# Patient Record
Sex: Female | Born: 2000 | Race: White | Hispanic: No | Marital: Single | Smoking: Never smoker
Health system: Southern US, Community
[De-identification: ages and names within clinical notes are randomized; demographics above are authoritative.]

## PROBLEM LIST (undated history)

## (undated) DIAGNOSIS — F419 Anxiety disorder, unspecified: Secondary | ICD-10-CM

## (undated) DIAGNOSIS — F32A Depression, unspecified: Secondary | ICD-10-CM

## (undated) HISTORY — DX: Depression, unspecified: F32.A

## (undated) HISTORY — DX: Anxiety disorder, unspecified: F41.9

## (undated) HISTORY — PX: WRIST FRACTURE SURGERY: SHX121

---
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
## 2019-12-05 ENCOUNTER — Ambulatory Visit
Admission: EM | Admit: 2019-12-05 | Discharge: 2019-12-05 | Disposition: A | Discharge status: Home / Self Care | Payer: Medicaid Other | Attending: Urgent Care | Admitting: Urgent Care

## 2019-12-05 DIAGNOSIS — R070 Pain in throat: Secondary | ICD-10-CM

## 2019-12-05 DIAGNOSIS — J3489 Other specified disorders of nose and nasal sinuses: Secondary | ICD-10-CM | POA: Diagnosis present

## 2019-12-05 DIAGNOSIS — J069 Acute upper respiratory infection, unspecified: Secondary | ICD-10-CM | POA: Diagnosis present

## 2019-12-05 LAB — POCT RAPID STREP A (OFFICE): Rapid Strep A Screen: NEGATIVE

## 2019-12-05 MED ORDER — PSEUDOEPHEDRINE HCL 60 MG PO TABS: 30.0000 mg | ORAL_TABLET | Freq: Three times a day (TID) | ORAL | 0 refills | Status: DC | PRN

## 2019-12-05 MED ORDER — CETIRIZINE HCL 10 MG PO TABS: 10.0000 mg | ORAL_TABLET | Freq: Every day | ORAL | 0 refills | Status: DC

## 2019-12-05 MED ORDER — PROMETHAZINE-DM 6.25-15 MG/5ML PO SYRP: 5.0000 mL | ORAL_SOLUTION | Freq: Every evening | ORAL | 0 refills | Status: DC | PRN

## 2019-12-05 MED ORDER — BENZONATATE 100 MG PO CAPS: 100.0000 mg | ORAL_CAPSULE | Freq: Three times a day (TID) | ORAL | 0 refills | Status: DC | PRN

## 2019-12-05 NOTE — ED Provider Notes (Signed)
Vivien Rossetti   MRN: [id number] DOB: 10/21/2000  Subjective:   Sonya Sanders is a 19 y.o. female presenting for 4-day history of cute onset throat pain, runny stuffy nose, cough, subjective fever.  Patient is likely vaccinated.  She has not had COVID-19.  Has not tried medications for relief.  No current facility-administered medications for this encounter.  Current Outpatient Medications:  .  naproxen sodium (ALEVE) 220 MG tablet, Take 220 mg by mouth., Disp: , Rfl:  .  ondansetron (ZOFRAN) 4 MG tablet, Take 1 tablet (4 mg total) by mouth every 8 (eight) hours as needed for nausea or vomiting., Disp: 20 tablet, Rfl: 0   No Known Allergies  History reviewed. No pertinent past medical history.   Past Surgical History:  Procedure Laterality Date  . WRIST FRACTURE SURGERY      Family History  Problem Relation Age of Onset  . Healthy Mother   . Diabetes Father     Social History   Tobacco Use  . Smoking status: Never Smoker  . Smokeless tobacco: Never Used  Vaping Use  . Vaping Use: Every day  . Substances: Flavoring  Substance Use Topics  . Alcohol use: No  . Drug use: No    ROS   Objective:   Vitals: BP 114/81 (BP Location: Left Arm)   Pulse 96   Temp 99.8 F (37.7 C) (Oral)   Resp 18   LMP 11/07/2019   SpO2 98%   Physical Exam Constitutional:      General: She is not in acute distress.    Appearance: Normal appearance. She is well-developed. She is not ill-appearing, toxic-appearing or diaphoretic.  HENT:     Head: Normocephalic and atraumatic.     Right Ear: Tympanic membrane and ear canal normal. No drainage or tenderness. No middle ear effusion. Tympanic membrane is not erythematous.     Left Ear: Tympanic membrane and ear canal normal. No drainage or tenderness.  No middle ear effusion. Tympanic membrane is not erythematous.     Nose: Nose normal. No congestion or rhinorrhea.     Mouth/Throat:     Mouth: Mucous membranes are moist.  No oral lesions.     Pharynx: No pharyngeal swelling, oropharyngeal exudate, posterior oropharyngeal erythema or uvula swelling.     Tonsils: No tonsillar exudate or tonsillar abscesses.  Eyes:     General: No scleral icterus.       Right eye: No discharge.        Left eye: No discharge.     Extraocular Movements: Extraocular movements intact.     Right eye: Normal extraocular motion.     Left eye: Normal extraocular motion.     Conjunctiva/sclera: Conjunctivae normal.     Pupils: Pupils are equal, round, and reactive to light.  Cardiovascular:     Rate and Rhythm: Normal rate and regular rhythm.     Pulses: Normal pulses.     Heart sounds: Normal heart sounds. No murmur heard.  No friction rub. No gallop.   Pulmonary:     Effort: Pulmonary effort is normal. No respiratory distress.     Breath sounds: Normal breath sounds. No stridor. No wheezing, rhonchi or rales.  Musculoskeletal:     Cervical back: Normal range of motion and neck supple.  Lymphadenopathy:     Cervical: No cervical adenopathy.  Skin:    General: Skin is warm and dry.     Findings: No rash.  Neurological:     General:  No focal deficit present.     Mental Status: She is alert and oriented to person, place, and time.  Psychiatric:        Mood and Affect: Mood normal.        Behavior: Behavior normal.        Thought Content: Thought content normal.        Judgment: Judgment normal.     Results for orders placed or performed during the hospital encounter of 12/05/19 (from the past 24 hour(s))  POCT rapid strep A     Status: None   Collection Time: 12/05/19  6:07 PM  Result Value Ref Range   Rapid Strep A Screen Negative Negative    Assessment and Plan :   PDMP not reviewed this encounter.  1. Viral URI with cough   2. Throat pain   3. Stuffy and runny nose     Will manage for viral illness such as viral URI, viral syndrome, viral rhinitis, COVID-19. Counseled patient on nature of COVID-19 including  modes of transmission, diagnostic testing, management and supportive care.  Offered scripts for symptomatic relief. COVID 19 testing is pending. Counseled patient on potential for adverse effects with medications prescribed/recommended today, ER and return-to-clinic precautions discussed, patient verbalized understanding.     Wallis Bamberg, PA-C 12/06/19 1016

## 2019-12-05 NOTE — ED Triage Notes (Signed)
Pt reports sore throat starting 4 days worsening last night.  Also reports nasal congestion, cough, possibly fevered.

## 2019-12-05 NOTE — Discharge Instructions (Signed)

## 2019-12-07 LAB — NOVEL CORONAVIRUS, NAA: SARS-CoV-2, NAA: NOT DETECTED

## 2019-12-07 LAB — SARS-COV-2, NAA 2 DAY TAT

## 2019-12-09 LAB — CULTURE, GROUP A STREP (THRC)

## 2020-04-04 IMAGING — DX RIGHT WRIST - COMPLETE 3+ VIEW
4 series · 4 of 4 positions shown · non-contrast
Comparison: None.

CLINICAL DATA: Status post fall with pain.

EXAM:
RIGHT WRIST - COMPLETE 3+ VIEW

[wrist ap (1 of 2)]
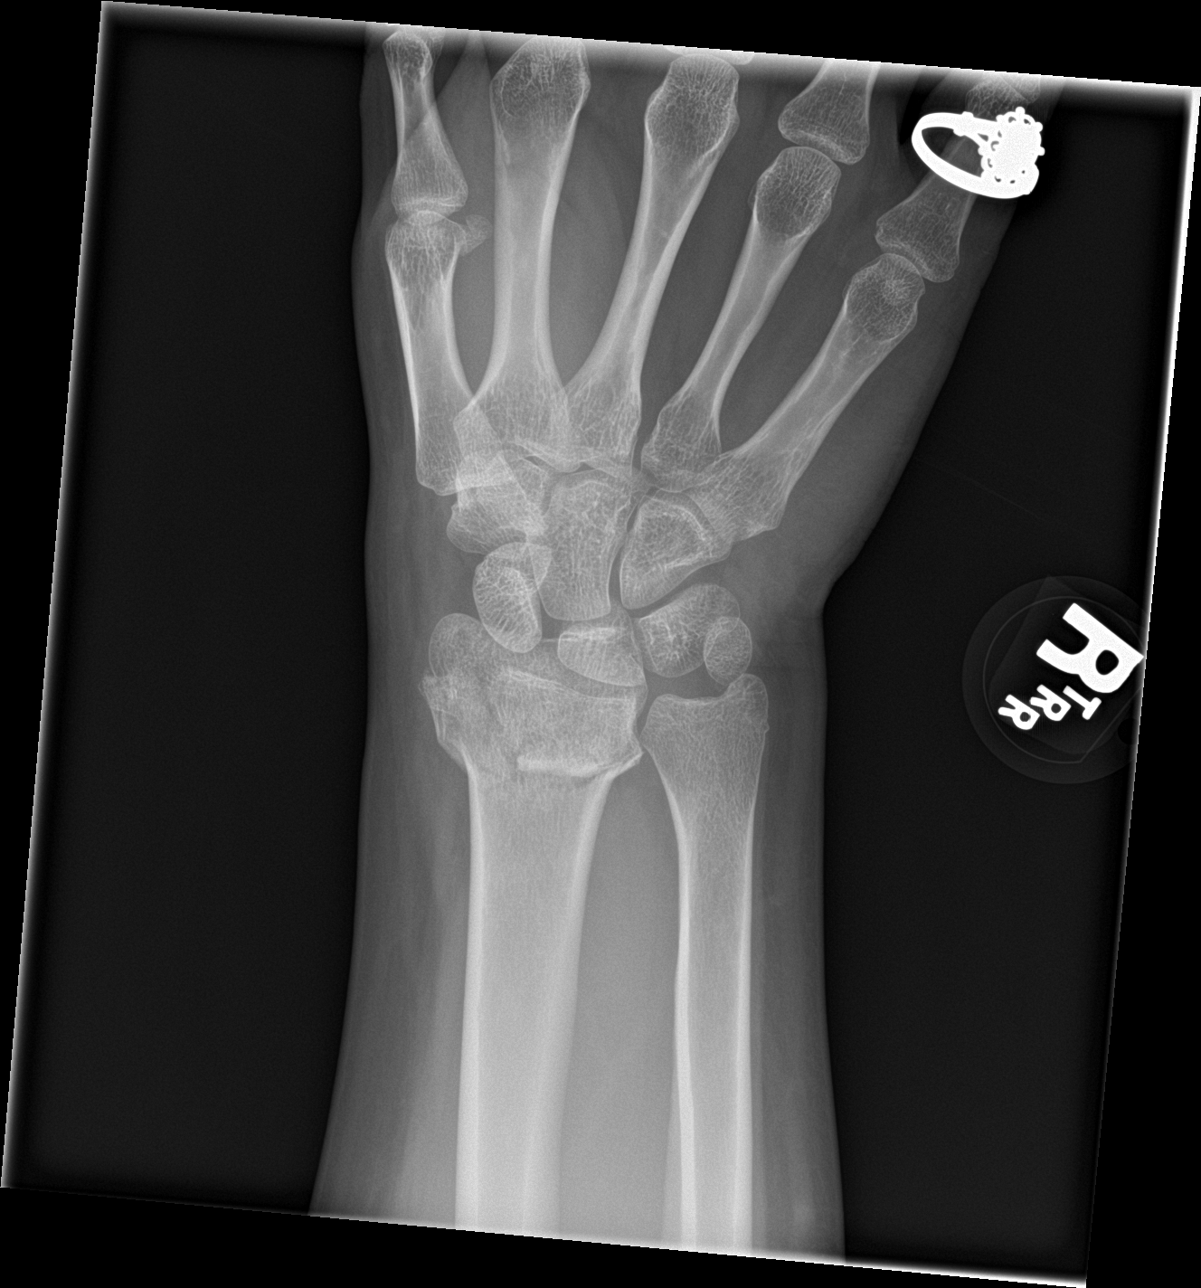

[wrist obl]
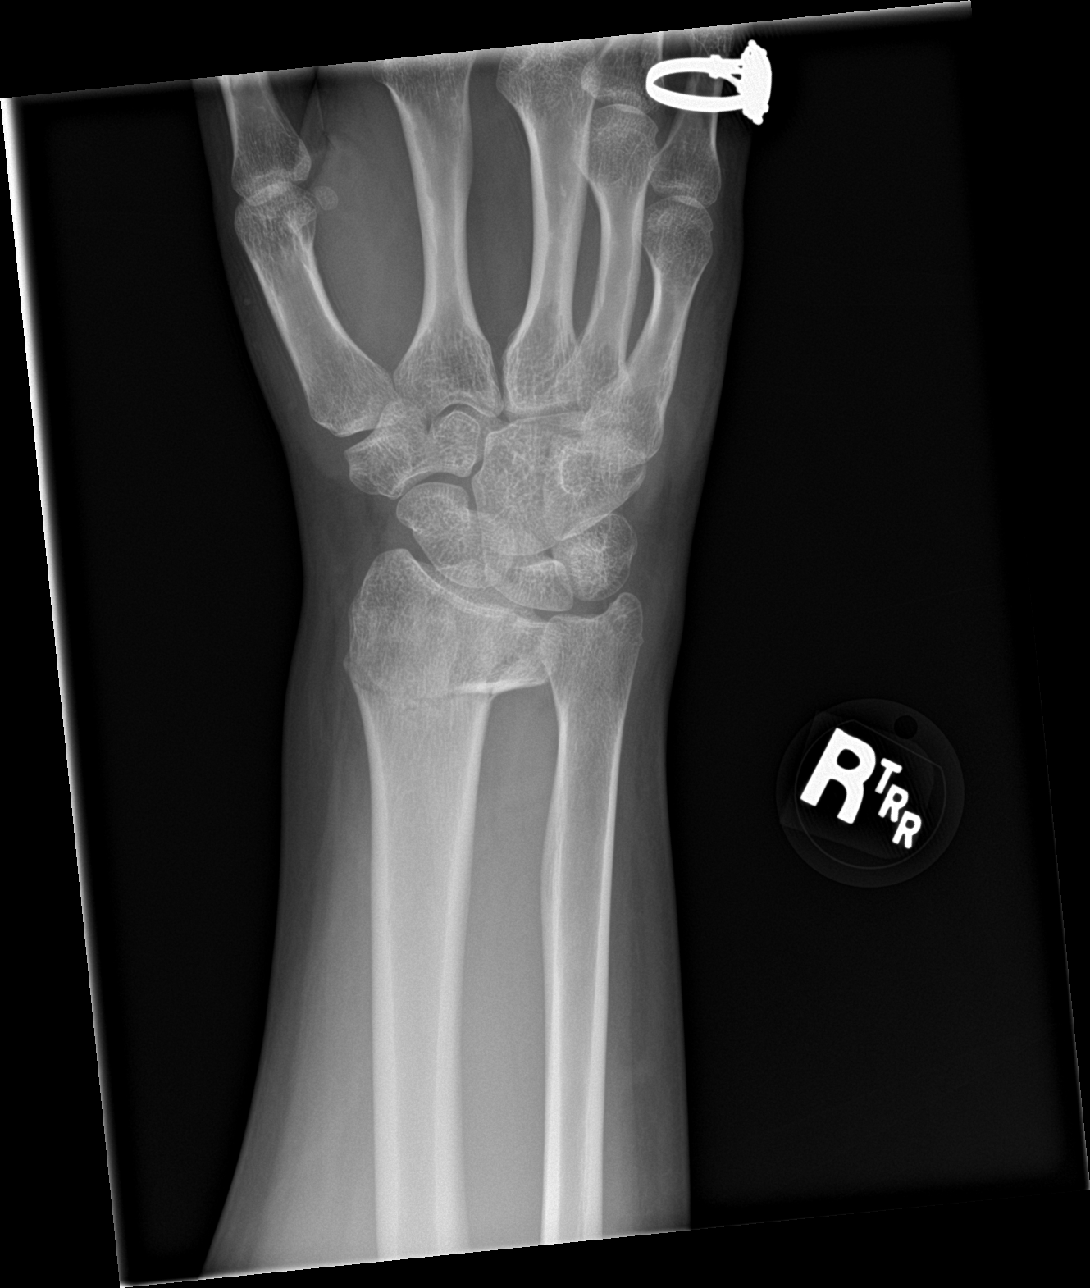

[wrist lat]
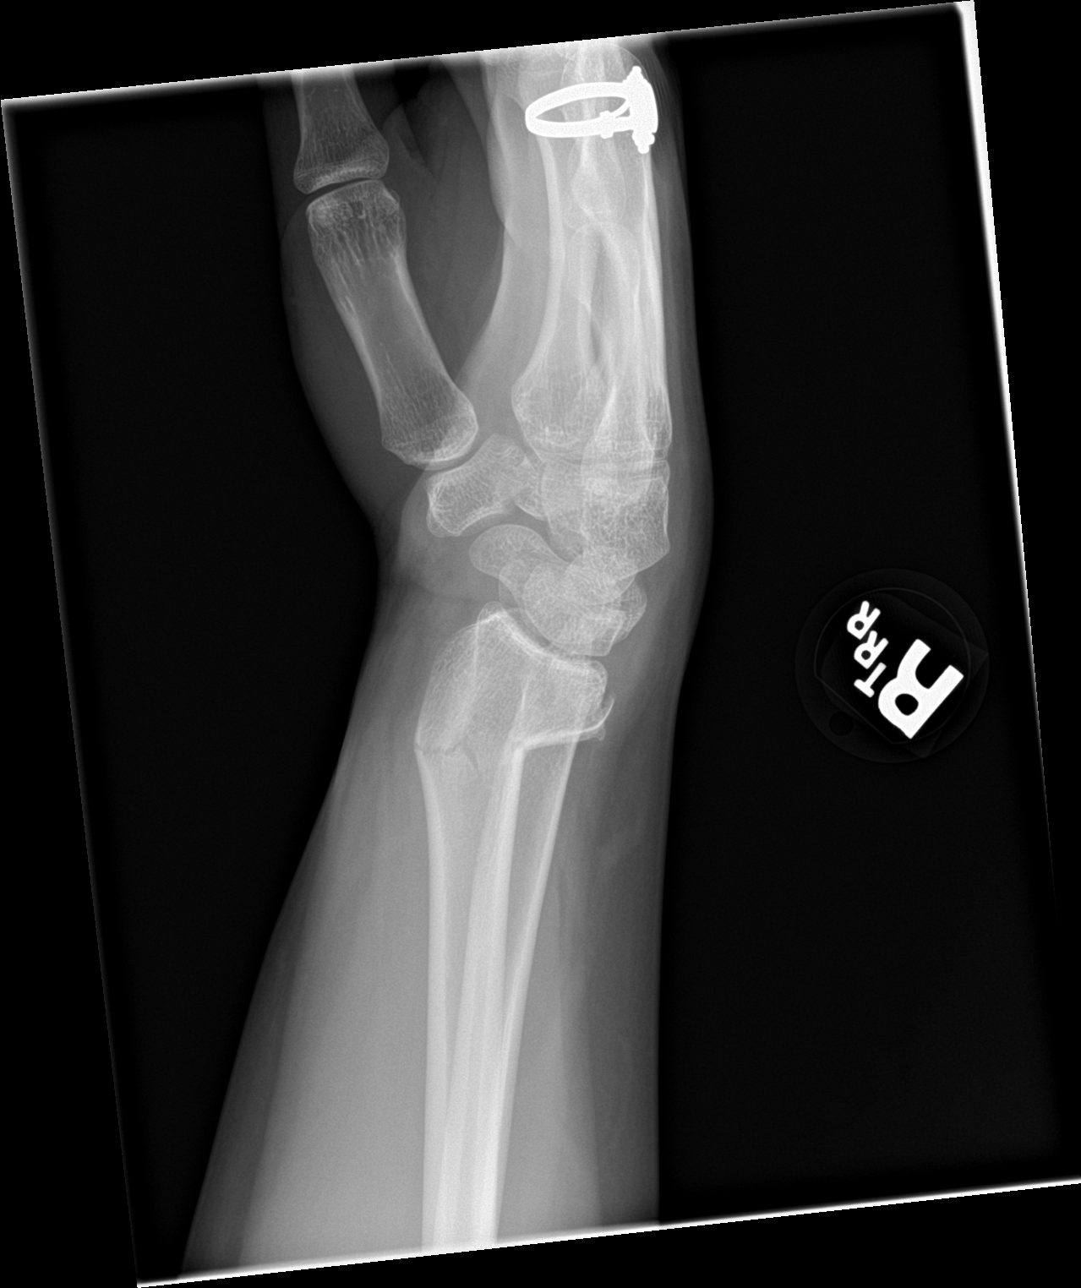

[wrist ap (2 of 2)]
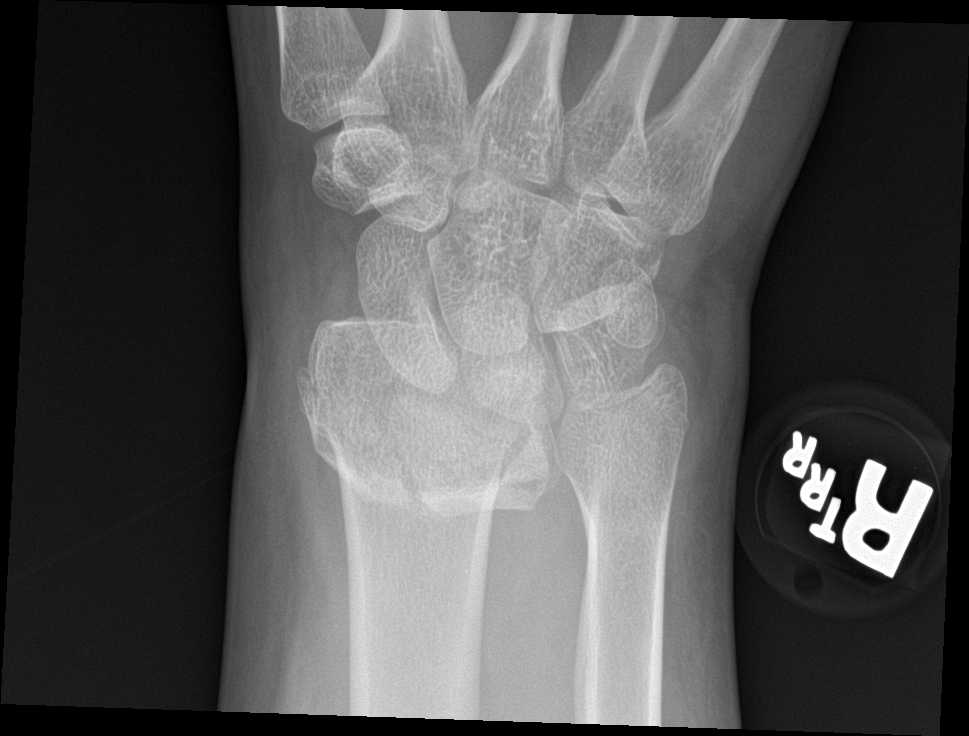

[4 of 4 positions shown; findings below may reference images not displayed]

FINDINGS: There is a comminuted fracture of the distal radius with
intra-articular extension to the radiocarpal joint. The fracture is
mildly impacted with dorsal angulation of the distal fracture
fragment.

Overlying soft tissue swelling.
IMPRESSION: Comminuted impacted intra-articular fracture of the distal radius
with dorsal angulation of the distal fracture fragment.

## 2021-11-24 ENCOUNTER — Emergency Department
Admission: EM | Admit: 2021-11-24 | Discharge: 2021-11-24 | Disposition: A | Discharge status: Home / Self Care | Payer: BC Managed Care – PPO | Attending: Emergency Medicine | Admitting: Emergency Medicine

## 2021-11-24 ENCOUNTER — Other Ambulatory Visit: Payer: Self-pay

## 2021-11-24 ENCOUNTER — Emergency Department: Payer: BC Managed Care – PPO

## 2021-11-24 DIAGNOSIS — K921 Melena: Secondary | ICD-10-CM | POA: Diagnosis not present

## 2021-11-24 DIAGNOSIS — K529 Noninfective gastroenteritis and colitis, unspecified: Secondary | ICD-10-CM

## 2021-11-24 DIAGNOSIS — R1013 Epigastric pain: Secondary | ICD-10-CM | POA: Diagnosis present

## 2021-11-24 LAB — CBC
HCT: 42.9 % (ref 36.0–46.0)
Hemoglobin: 13.5 g/dL (ref 12.0–15.0)
MCH: 26.6 pg (ref 26.0–34.0)
MCHC: 31.5 g/dL (ref 30.0–36.0)
MCV: 84.4 fL (ref 80.0–100.0)
Platelets: 322 10*3/uL (ref 150–400)
RBC: 5.08 MIL/uL (ref 3.87–5.11)
RDW: 13 % (ref 11.5–15.5)
WBC: 10.5 10*3/uL (ref 4.0–10.5)
nRBC: 0 % (ref 0.0–0.2)

## 2021-11-24 LAB — TYPE AND SCREEN
ABO/RH(D): A POS
Antibody Screen: NEGATIVE

## 2021-11-24 LAB — URINALYSIS, ROUTINE W REFLEX MICROSCOPIC
Bilirubin Urine: NEGATIVE
Glucose, UA: NEGATIVE mg/dL
Ketones, ur: NEGATIVE mg/dL
Leukocytes,Ua: NEGATIVE
Nitrite: NEGATIVE
Protein, ur: NEGATIVE mg/dL
Specific Gravity, Urine: 1.003 — ABNORMAL LOW (ref 1.005–1.030)
WBC, UA: NONE SEEN WBC/hpf (ref 0–5)
pH: 6 (ref 5.0–8.0)

## 2021-11-24 LAB — COMPREHENSIVE METABOLIC PANEL
ALT: 18 U/L (ref 0–44)
AST: 23 U/L (ref 15–41)
Albumin: 4.1 g/dL (ref 3.5–5.0)
Alkaline Phosphatase: 42 U/L (ref 38–126)
Anion gap: 8 (ref 5–15)
BUN: 11 mg/dL (ref 6–20)
CO2: 26 mmol/L (ref 22–32)
Calcium: 9.2 mg/dL (ref 8.9–10.3)
Chloride: 104 mmol/L (ref 98–111)
Creatinine, Ser: 0.64 mg/dL (ref 0.44–1.00)
GFR, Estimated: >60 mL/min (ref >60)
Glucose, Bld: 89 mg/dL (ref 70–99)
Potassium: 3.9 mmol/L (ref 3.5–5.1)
Sodium: 138 mmol/L (ref 135–145)
Total Bilirubin: 0.7 mg/dL (ref 0.3–1.2)
Total Protein: 8 g/dL (ref 6.5–8.1)

## 2021-11-24 LAB — LIPASE, BLOOD: Lipase: 35 U/L (ref 11–51)

## 2021-11-24 LAB — POC URINE PREG, ED: Preg Test, Ur: NEGATIVE

## 2021-11-24 MED ORDER — LACTATED RINGERS IV BOLUS
1000.0000 mL | Freq: Once | INTRAVENOUS | Status: AC
  Administered 2021-11-24: 1000 mL via INTRAVENOUS

## 2021-11-24 MED ORDER — AMOXICILLIN-POT CLAVULANATE 875-125 MG PO TABS: 1.0000 | ORAL_TABLET | Freq: Two times a day (BID) | ORAL | 0 refills | Status: AC

## 2021-11-24 MED ORDER — IOHEXOL 300 MG/ML  SOLN
100.0000 mL | Freq: Once | INTRAMUSCULAR | Status: AC | PRN
  Administered 2021-11-24: 100 mL via INTRAVENOUS

## 2021-11-24 MED ORDER — AMOXICILLIN-POT CLAVULANATE 875-125 MG PO TABS: 1.0000 | ORAL_TABLET | Freq: Two times a day (BID) | ORAL | 0 refills | Status: DC

## 2021-11-24 MED ORDER — ONDANSETRON 4 MG PO TBDP: 4.0000 mg | ORAL_TABLET | Freq: Three times a day (TID) | ORAL | 0 refills | Status: DC | PRN

## 2021-11-24 NOTE — ED Triage Notes (Signed)
Pt c/o intermittent upper abdominal cramping, nausea, and diarrhea x1 day.  Pain score 8/10.  Pt reports multiple BMs yesterday and sts last BM included bright red blood.

## 2021-11-24 NOTE — ED Provider Notes (Signed)
[facility] Provider Note    Event Date/Time   First MD Initiated Contact with Patient 11/24/21 1658     (approximate)   History   Chief Complaint Abdominal Pain, Nausea, and Diarrhea   HPI  Sonya Sanders is a 21 y.o. female with no significant past medical history who presents to the ED complaining of abdominal pain.  Patient reports that she has been dealing with about 24 hours of achy pain in her epigastrium and left lower quadrant of her abdomen.  She describes the pain as constant and not exacerbated or alleviated by anything.  It has been associated with multiple watery bowel movements and she has started to notice some bright red blood in her stool.  She has felt nauseous, but has not vomited.  She states that she started her menstrual period earlier today, denies any abnormal vaginal bleeding or discharge.  She has not had any fevers, dysuria, hematuria, or flank pain.  She denies any history of GI bleeding and does not take any blood thinners, does admit to taking ibuprofen on a regular basis.      Physical Exam   Triage Vital Signs: ED Triage Vitals  Enc Vitals Group     BP 11/24/21 1436 121/73     Pulse Rate 11/24/21 1436 68     Resp 11/24/21 1436 16     Temp 11/24/21 1436 98 F (36.7 C)     Temp Source 11/24/21 1436 Oral     SpO2 11/24/21 1436 100 %     Weight 11/24/21 1526 205 lb (93 kg)     Height 11/24/21 1526 5\' 7"  (1.702 m)     Head Circumference --      Peak Flow --      Pain Score 11/24/21 1525 8     Pain Loc --      Pain Edu? --      Excl. in GC? --     Most recent vital signs: Vitals:   11/24/21 1436  BP: 121/73  Pulse: 68  Resp: 16  Temp: 98 F (36.7 C)  SpO2: 100%    Constitutional: Alert and oriented. Eyes: Conjunctivae are normal. Head: Atraumatic. Nose: No congestion/rhinnorhea. Mouth/Throat: Mucous membranes are moist.  Cardiovascular: Normal rate, regular rhythm. Grossly normal heart sounds.  2+ radial  pulses bilaterally. Respiratory: Normal respiratory effort.  No retractions. Lungs CTAB. Gastrointestinal: Soft and tender to palpation in the left lower quadrant with no rebound or guarding. No distention. Musculoskeletal: No lower extremity tenderness nor edema.  Neurologic:  Normal speech and language. No gross focal neurologic deficits are appreciated.    ED Results / Procedures / Treatments   Labs (all labs ordered are listed, but only abnormal results are displayed) Labs Reviewed  URINALYSIS, ROUTINE W REFLEX MICROSCOPIC - Abnormal; Notable for the following components:      Result Value   Color, Urine STRAW (*)    APPearance CLEAR (*)    Specific Gravity, Urine 1.003 (*)    Hgb urine dipstick MODERATE (*)    Bacteria, UA RARE (*)    All other components within normal limits  LIPASE, BLOOD  COMPREHENSIVE METABOLIC PANEL  CBC  POC URINE PREG, ED  POC OCCULT BLOOD, ED  POC URINE PREG, ED  TYPE AND SCREEN   RADIOLOGY CT of abdomen/pelvis reviewed and interpreted by me with inflammatory changes in the colon, no dilated bowel loops or focal fluid collections noted.  PROCEDURES:  Critical Care performed:  No  Procedures   MEDICATIONS ORDERED IN ED: Medications  lactated ringers bolus 1,000 mL (1,000 mLs Intravenous New Bag/Given 11/24/21 1826)  iohexol (OMNIPAQUE) 300 MG/ML solution 100 mL (100 mLs Intravenous Contrast Given 11/24/21 1842)     IMPRESSION / MDM / ASSESSMENT AND PLAN / ED COURSE  I reviewed the triage vital signs and the nursing notes.                              21 y.o. female with no significant past medical history who presents to the ED complaining of about 24 hours of left lower quadrant and epigastric abdominal pain associated with watery bowel movements and nausea, has now noticed some blood in her stool.  Patient's presentation is most consistent with acute presentation with potential threat to life or bodily function.  Differential  diagnosis includes, but is not limited to, lower GI bleed, upper GI bleed, rectal bleeding, anemia, electrolyte abnormality, AKI, diverticulitis, colitis, UTI.  Patient well-appearing and in no acute distress, vital signs are unremarkable.  She does have tenderness to palpation in the left lower quadrant of her abdomen and we will further assess with CT scan for evidence of diverticulitis which could be contributing to diverticular bleeding.  It does seem more likely that she has a gastroenteritis or colitis contributing to mild bleeding.  Labs thus far are reassuring with no significant anemia, leukocytosis, electrolyte abnormality, or AKI.  LFTs and lipase are within normal limits, pregnancy testing and urinalysis are pending at this time.  We will hydrate with IV fluids, patient declines pain or nausea medication.  CT scan is consistent with colitis, suspect infectious cause.  Given her bloody stools, she would benefit from treatment with antibiotics, admission considered but patient has not had any further bowel movements here in the ED and she is appropriate for discharge home with PCP follow-up.  We will also prescribe nausea medication and she was counseled to establish care with PCP, also counseled to return to the ED for new or worsening symptoms.  Patient agrees with plan.      FINAL CLINICAL IMPRESSION(S) / ED DIAGNOSES   Final diagnoses:  Colitis  Hematochezia     Rx / DC Orders   ED Discharge Orders          Ordered    amoxicillin-clavulanate (AUGMENTIN) 875-125 MG tablet  2 times daily,   Status:  Discontinued        11/24/21 1930    ondansetron (ZOFRAN-ODT) 4 MG disintegrating tablet  Every 8 hours PRN,   Status:  Discontinued        11/24/21 1930    amoxicillin-clavulanate (AUGMENTIN) 875-125 MG tablet  2 times daily        11/24/21 1931    ondansetron (ZOFRAN-ODT) 4 MG disintegrating tablet  Every 8 hours PRN        11/24/21 1931             Note:  This document  was prepared using Dragon voice recognition software and may include unintentional dictation errors.   Chesley Noon, MD 11/24/21 [phone]

## 2022-03-30 ENCOUNTER — Telehealth: Payer: Self-pay | Admitting: Nurse Practitioner

## 2022-03-30 ENCOUNTER — Telehealth: Payer: Self-pay | Admitting: Licensed Clinical Social Worker

## 2022-03-30 NOTE — Telephone Encounter (Signed)
Pt no longer wants to schedule an appt

## 2022-03-30 NOTE — Telephone Encounter (Signed)
Pt is interested in scheduling NPA on 05/20/21 at 1:00p Please advise Cb- [phone]

## 2022-03-30 NOTE — Telephone Encounter (Signed)
LCSW spoke with patient regarding her request for an evaluation for medication management. LCSW discussed options going through PCP or [facility]. LCSW informed patient that she could come to [facility] for therapy if she is connected to med management through PCP, but if she goes to [facility] for med management she would need to have therapy there.

## 2022-04-13 NOTE — L&D Delivery Note (Signed)
Delivery Note  Date of delivery: 03/11/2023 Estimated Date of Delivery: 03/10/23 Patient's last menstrual period was 06/03/2022. EGA: [redacted]w[redacted]d  Delivery Note At 1:21 PM a viable female was delivered via Vaginal, Spontaneous (Presentation:   Occiput Anterior).  APGAR: 7, 9; weight 7 lb 10.8 oz (3480 g).  Placenta status: Spontaneous, Intact.  Cord: 3 vessels with the following complications: None.      First Stage: Labor onset: 0647 Augmentation : AROM, Cytotec, Pitocin Analgesia Sonya Sanders intrapartum: Epidural AROM at [name] presented to L&D for IOL for obesity. She was induced/augmented with cytotec, pitocin, and AROM. Epidural placed for pain relief.   Second Stage: Complete dilation at 1313 Onset of pushing at 1313 FHR second stage Cat I Delivery at 1321 on 03/11/2023  She progressed to complete and had a spontaneous vaginal birth of a live female over an intact perineum. The fetal head was delivered in OA position with restitution to LOA. No nuchal cord. Anterior then posterior shoulders delivered spontaneously. Baby placed on mom's abdomen and attended to by transition RN. Cord clamped and cut by CNM and baby taken to the warmer for further evaluation.   Third Stage: Placenta delivered intact with 3VC at 1324 Placenta disposition: pathology Uterine tone firm / bleeding min IV pitocin given for hemorrhage prophylaxis  Anesthesia: Epidural Episiotomy: None Lacerations: 1st degree Suture Repair: 2.0 vicryl Est. Blood Loss (mL):  150  Complications: none  Mom to postpartum.  Baby to Couplet care / Skin to Skin.  Newborn: Birth Weight: 7 lb 10.8 oz (3480 g)  Apgar Scores: 7,9 Feeding planned: breastfeeding   Sonya Sanders, CNM 03/11/2023 2:24 PM

## 2022-07-06 ENCOUNTER — Ambulatory Visit (LOCAL_COMMUNITY_HEALTH_CENTER): Payer: Medicaid Other

## 2022-07-06 VITALS — BP 120/80 | Ht 67.0 in | Wt 217.5 lb

## 2022-07-06 DIAGNOSIS — Z3201 Encounter for pregnancy test, result positive: Secondary | ICD-10-CM | POA: Diagnosis not present

## 2022-07-06 LAB — PREGNANCY, URINE: Preg Test, Ur: POSITIVE — AB

## 2022-07-06 MED ORDER — PRENATAL 27-0.8 MG PO TABS: 1.0000 | ORAL_TABLET | Freq: Every day | ORAL | 0 refills | Status: AC

## 2022-07-06 NOTE — Progress Notes (Signed)
UPT positive. Unsure where she plans prenatal care. Local prenatal provider list given. Positive preg packet given and reviewed.   Hx anxiety/depression as a teen. PHQ (2-9) = 7 today. Denies thoughts of harming self. Has support system. Accepts contact card for  A Marchia Bond, LCSW at [facility] and [facility] contact card plus info sheet on "How to Find a Therapist."  The patient was dispensed prenatal vitamins #100 today per SO Dr Vertell Novak. I provided counseling today regarding the medication. We discussed the medication, the side effects and when to call clinic. Patient given the opportunity to ask questions. Questions answered.    Sent to DSS for medicaid/preg women. Josie Saunders, RN

## 2023-01-25 DIAGNOSIS — O99210 Obesity complicating pregnancy, unspecified trimester: Secondary | ICD-10-CM | POA: Insufficient documentation

## 2023-02-12 DIAGNOSIS — F32A Depression, unspecified: Secondary | ICD-10-CM | POA: Insufficient documentation

## 2023-03-09 ENCOUNTER — Other Ambulatory Visit: Payer: Self-pay | Admitting: Certified Nurse Midwife

## 2023-03-09 DIAGNOSIS — Z3490 Encounter for supervision of normal pregnancy, unspecified, unspecified trimester: Secondary | ICD-10-CM

## 2023-03-09 NOTE — Progress Notes (Signed)
G1P0000 at [redacted]w[redacted]d, LMP of 08/18/22, c/w early [place] at [redacted]w[redacted]d.  Scheduled for induction of labor for BMI >30 on 03/10/23.   Prenatal provider: [facility] Pregnancy complicated by: Obesity Anxiety and depression Hx of heart murmurs Elevated 1hr GTT Chronic HA  Prenatal Labs: Blood type/Rh A positive  Antibody screen neg  Rubella Immune  Varicella Immune  RPR NR  HBsAg Neg  HIV NR  GC neg  Chlamydia neg  Genetic screening cfDNA negative   1 hour GTT 171  3 hour GTT 79, 178, 157, 124   GBS Negative   Tdap: received AP Flu: received AP RSV: received AP Contraception: OCPs Feeding preference: breastfeeding  ____ Janyce Llanos, CNM  Certified Nurse Midwife [facility]  Clinic OB/GYN [facility]

## 2023-03-10 ENCOUNTER — Other Ambulatory Visit: Payer: Self-pay

## 2023-03-10 ENCOUNTER — Inpatient Hospital Stay
Admission: EM | Admit: 2023-03-10 | Discharge: 2023-03-12 | DRG: 807 | Disposition: A | Discharge status: Home / Self Care | Payer: Medicaid Other | Attending: Certified Nurse Midwife | Admitting: Certified Nurse Midwife

## 2023-03-10 ENCOUNTER — Encounter: Payer: Self-pay | Admitting: Obstetrics and Gynecology

## 2023-03-10 DIAGNOSIS — O99214 Obesity complicating childbirth: Secondary | ICD-10-CM | POA: Diagnosis present

## 2023-03-10 DIAGNOSIS — Z3A40 40 weeks gestation of pregnancy: Secondary | ICD-10-CM | POA: Diagnosis not present

## 2023-03-10 DIAGNOSIS — E66813 Obesity, class 3: Secondary | ICD-10-CM | POA: Diagnosis present

## 2023-03-10 DIAGNOSIS — Z833 Family history of diabetes mellitus: Secondary | ICD-10-CM

## 2023-03-10 DIAGNOSIS — Z3490 Encounter for supervision of normal pregnancy, unspecified, unspecified trimester: Principal | ICD-10-CM | POA: Diagnosis present

## 2023-03-10 LAB — CBC
HCT: 39.8 % (ref 36.0–46.0)
Hemoglobin: 13 g/dL (ref 12.0–15.0)
MCH: 27.2 pg (ref 26.0–34.0)
MCHC: 32.7 g/dL (ref 30.0–36.0)
MCV: 83.3 fL (ref 80.0–100.0)
Platelets: 236 10*3/uL (ref 150–400)
RBC: 4.78 MIL/uL (ref 3.87–5.11)
RDW: 14.8 % (ref 11.5–15.5)
WBC: 14.3 10*3/uL — ABNORMAL HIGH (ref 4.0–10.5)
nRBC: 0 % (ref 0.0–0.2)

## 2023-03-10 LAB — RPR: RPR Ser Ql: NONREACTIVE

## 2023-03-10 LAB — TYPE AND SCREEN
ABO/RH(D): A POS
Antibody Screen: NEGATIVE

## 2023-03-10 MED ORDER — OXYTOCIN BOLUS FROM INFUSION
333.0000 mL | Freq: Once | INTRAVENOUS | Status: AC
  Administered 2023-03-11: 333 mL via INTRAVENOUS

## 2023-03-10 MED ORDER — LACTATED RINGERS IV SOLN
500.0000 mL | INTRAVENOUS | Status: DC | PRN
  Administered 2023-03-10 (×2): 500 mL via INTRAVENOUS

## 2023-03-10 MED ORDER — LACTATED RINGERS IV SOLN: INTRAVENOUS | Status: AC

## 2023-03-10 MED ORDER — MISOPROSTOL 25 MCG QUARTER TABLET
25.0000 ug | ORAL_TABLET | ORAL | Status: DC
  Administered 2023-03-10 (×4): 25 ug via VAGINAL
  Filled 2023-03-10 (×4): qty 1

## 2023-03-10 MED ORDER — MISOPROSTOL 200 MCG PO TABS
ORAL_TABLET | ORAL | Status: AC
  Filled 2023-03-10: qty 4

## 2023-03-10 MED ORDER — LIDOCAINE HCL (PF) 1 % IJ SOLN
30.0000 mL | INTRAMUSCULAR | Status: DC | PRN
  Filled 2023-03-10: qty 30

## 2023-03-10 MED ORDER — OXYTOCIN 10 UNIT/ML IJ SOLN
INTRAMUSCULAR | Status: AC
  Filled 2023-03-10: qty 2

## 2023-03-10 MED ORDER — OXYTOCIN-SODIUM CHLORIDE 30-0.9 UT/500ML-% IV SOLN
2.5000 [IU]/h | INTRAVENOUS | Status: DC
  Filled 2023-03-10: qty 500

## 2023-03-10 MED ORDER — FENTANYL CITRATE (PF) 100 MCG/2ML IJ SOLN
50.0000 ug | INTRAMUSCULAR | Status: DC | PRN
  Administered 2023-03-10: 50 ug via INTRAVENOUS

## 2023-03-10 MED ORDER — AMMONIA AROMATIC IN INHA
RESPIRATORY_TRACT | Status: AC
  Filled 2023-03-10: qty 10

## 2023-03-10 MED ORDER — SOD CITRATE-CITRIC ACID 500-334 MG/5ML PO SOLN: 30.0000 mL | ORAL | Status: DC | PRN

## 2023-03-10 MED ORDER — MISOPROSTOL 25 MCG QUARTER TABLET
25.0000 ug | ORAL_TABLET | ORAL | Status: DC
  Administered 2023-03-10 (×4): 25 ug via ORAL
  Filled 2023-03-10 (×4): qty 1

## 2023-03-10 MED ORDER — TERBUTALINE SULFATE 1 MG/ML IJ SOLN: 0.2500 mg | Freq: Once | INTRAMUSCULAR | Status: DC | PRN

## 2023-03-10 MED ORDER — OXYTOCIN-SODIUM CHLORIDE 30-0.9 UT/500ML-% IV SOLN
1.0000 m[IU]/min | INTRAVENOUS | Status: DC
  Administered 2023-03-10: 2 m[IU]/min via INTRAVENOUS
  Administered 2023-03-11: 8 m[IU]/min via INTRAVENOUS

## 2023-03-10 MED ORDER — FENTANYL CITRATE (PF) 100 MCG/2ML IJ SOLN
50.0000 ug | INTRAMUSCULAR | Status: DC | PRN
  Administered 2023-03-10: 100 ug via INTRAVENOUS
  Administered 2023-03-10: 50 ug via INTRAVENOUS
  Administered 2023-03-10: 100 ug via INTRAVENOUS
  Filled 2023-03-10 (×4): qty 2

## 2023-03-10 MED ORDER — ACETAMINOPHEN 325 MG PO TABS: 650.0000 mg | ORAL_TABLET | ORAL | Status: DC | PRN

## 2023-03-10 MED ORDER — ONDANSETRON HCL 4 MG/2ML IJ SOLN
4.0000 mg | Freq: Four times a day (QID) | INTRAMUSCULAR | Status: DC | PRN
  Administered 2023-03-11: 4 mg via INTRAVENOUS
  Filled 2023-03-10: qty 2

## 2023-03-10 NOTE — Progress Notes (Addendum)
OB Labor Note   S: Doing well. Amenable to cook catheter. Reports cramping but no real contractions.     O: BP 128/76 (BP Location: Right Arm)   Pulse 91   Temp 98.4 F (36.9 C) (Oral)   Resp 16   Ht 5\' 7"  (1.702 m)   Wt 116.1 kg   LMP 06/03/2022   BMI 40.10 kg/m  Temp (24hrs), Avg:98.3 F (36.8 C), Min:98 F (36.7 C), Max:98.5 F (36.9 C)    FHT: Baseline 145, mod variability, + accels, no decels. Cat 1  TOCO: not tracing well at times, previously q89min    Cervical Exam: 2/60/-3   A/P: Sonya Sanders is a 22 y.o. G1P0000 [redacted]w[redacted]d admitted for IOL at term for obesity.  - Cervical dilation: Presented 03/10/23 at 0000 and cervix was 0.5 cm. Received 4 doses of vaginal/oral cytotec (first at 0037 and last at 1414) and cervix changed to 1 cm at 1730. Cervix 2 cm at 1815 and cook catheter (80/80cc) placed. Will start pitocin. - CEFM: Cat 1  - Vitals: afebrile, HR normal, BP normal - Rh + - GBS neg  - Pain tolerable. Desires epidural at some point.   #Anxiety/depression: Not on any meds. Mood stable   Electronically signed by:  Romana Juniper, MD, 03/10/2023, 5:48 PM

## 2023-03-10 NOTE — H&P (Signed)
OB History & Physical   History of Present Illness:  Chief Complaint:   HPI:  Sonya Sanders is a 22 y.o. G1P0000 female at [redacted]w[redacted]d dated by LMP c/w [redacted]w[redacted]d [place].  She presents to L&D for scheduled IOL for obesity, BMI >30    Pregnancy Issues: Obesity Anxiety and depression Family Hx of heart murmurs Elevated 1hr GTT Chronic HA   Maternal Medical History:   Past Medical History:  Diagnosis Date   Anxiety    as a teenager   Depression    as a teenager    Past Surgical History:  Procedure Laterality Date   WRIST FRACTURE SURGERY Right 2020    No Known Allergies  Prior to Admission medications   Medication Sig Start Date End Date Taking? Authorizing Provider  benzonatate (TESSALON) 100 MG capsule Take 1-2 capsules (100-200 mg total) by mouth 3 (three) times daily as needed. Patient not taking: Reported on 07/06/2022 12/05/19   Wallis Bamberg, PA-C  cetirizine (ZYRTEC ALLERGY) 10 MG tablet Take 1 tablet (10 mg total) by mouth daily. Patient not taking: Reported on 07/06/2022 12/05/19   Wallis Bamberg, PA-C  naproxen sodium (ALEVE) 220 MG tablet Take 220 mg by mouth. Patient not taking: Reported on 07/06/2022    [provider]  ondansetron (ZOFRAN-ODT) 4 MG disintegrating tablet Take 1 tablet (4 mg total) by mouth every 8 (eight) hours as needed for nausea or vomiting. Patient not taking: Reported on 07/06/2022 11/24/21   Chesley Noon, MD  promethazine-dextromethorphan (PROMETHAZINE-DM) 6.25-15 MG/5ML syrup Take 5 mLs by mouth at bedtime as needed for cough. Patient not taking: Reported on 07/06/2022 12/05/19   Wallis Bamberg, PA-C  pseudoephedrine (SUDAFED) 60 MG tablet Take 0.5 tablets (30 mg total) by mouth every 8 (eight) hours as needed for congestion. Patient not taking: Reported on 07/06/2022 12/05/19   Wallis Bamberg, PA-C    Prenatal care site: [facility]   Social History: She  reports that she has never smoked. She has never used smokeless tobacco. She reports  that she does not currently use alcohol. She reports that she does not currently use drugs after having used the following drugs: Marijuana. Frequency: 2.00 times per week.  Family History: family history includes Diabetes in her father; Healthy in her mother.   Review of Systems: A full review of systems was performed and negative except as noted in the HPI.     Physical Exam:  Vital Signs: BP 115/75 (BP Location: Left Arm)   Pulse 96   Temp 98.5 F (36.9 C) (Oral)   Resp 18   Ht 5\' 7"  (1.702 m)   Wt 116.1 kg   LMP 06/03/2022   BMI 40.10 kg/m  General: no acute distress.  HEENT: normocephalic, atraumatic Heart: regular rate & rhythm.  No murmurs/rubs/gallops Lungs: clear to auscultation bilaterally, normal respiratory effort Abdomen: soft, gravid, non-tender;  EFW: 8lb Pelvic:   External: Normal external female genitalia  Cervix: Dilation: Fingertip / Effacement (%): 50 / Station: -2    Extremities: non-tender, symmetric, mild edema bilaterally.  DTRs: +2  Neurologic: Alert & oriented x 3.    Results for orders placed or performed during the hospital encounter of 03/10/23 (from the past 24 hour(s))  CBC     Status: Abnormal   Collection Time: 03/10/23 12:19 AM  Result Value Ref Range   WBC 14.3 (H) 4.0 - 10.5 K/uL   RBC 4.78 3.87 - 5.11 MIL/uL   Hemoglobin 13.0 12.0 - 15.0 g/dL  HCT 39.8 36.0 - 46.0 %   MCV 83.3 80.0 - 100.0 fL   MCH 27.2 26.0 - 34.0 pg   MCHC 32.7 30.0 - 36.0 g/dL   RDW 21.3 08.6 - 57.8 %   Platelets 236 150 - 400 K/uL   nRBC 0.0 0.0 - 0.2 %  Type and screen     Status: None (Preliminary result)   Collection Time: 03/10/23 12:19 AM  Result Value Ref Range   ABO/RH(D) PENDING    Antibody Screen PENDING    Sample Expiration      03/13/2023,2359 Performed at [facility], [address], [place], [place] [address]     Pertinent Results:  Prenatal Labs: Blood type/Rh A positive  Antibody screen neg  Rubella Immune  Varicella Immune   RPR NR  HBsAg Neg  HIV NR  GC neg  Chlamydia neg  Genetic screening cfDNA negative   1 hour GTT 171  3 hour GTT 79, 178, 157, 124   GBS Negative   FHT: 135bpm, moderate variability, accelerations present, no decelerations TOCO: occasional SVE:  Dilation: Fingertip / Effacement (%): 50 / Station: -2    Cephalic by leopolds  No results found.  Assessment:  Sonya Sanders is a 22 y.o. G1P0000 female at [redacted]w[redacted]d with IOL for obesity.   Plan:  1. Admit to Labor & Delivery; consents reviewed and obtained - Dr. Algis Downs. Schermerhorn aware of admission  2. Fetal Well being  - Fetal Tracing: Category I tracing - Group B Streptococcus ppx indicated: n/a, GBS negative - Presentation: vertex confirmed by SVE   3. Routine OB: - Prenatal labs reviewed, as above - Rh positive - CBC, T&S, RPR on admit - Clear fluids, saline lock  4. Induction of Labor -  Contractions rare, external toco in place -  Pelvis adequate for trial of labor -  Plan for induction with cytotec, pitocin, AROM -  Plan for continuous fetal monitoring  -  Maternal pain control as desired; requesting regional anesthesia - Anticipate vaginal delivery  5. Post Partum Planning: - Infant feeding: breastfeeding - Contraception: OCPs - Tdap: received AP - Flu: received AP - RSV: received AP  Janyce Llanos, CNM 03/10/23 12:59 AM

## 2023-03-10 NOTE — Progress Notes (Signed)
L&D Note    Subjective:  Patient is resting comfortably in bed. She states she feel mild cramping. "They feel like light period cramps."  Objective:   Vitals:   03/10/23 0032 03/10/23 0706 03/10/23 1155 03/10/23 1709  BP: 115/75 110/69 113/70 128/76  Pulse: 96 80 93 91  Resp: 18 15 16 16   Temp: 98.5 F (36.9 C) 98.2 F (36.8 C) 98 F (36.7 C) 98.4 F (36.9 C)  TempSrc: Oral Oral Oral Oral  Weight:      Height:        Current Vital Signs 24h Vital Sign Ranges  T 98.4 F (36.9 C) Temp  Avg: 98.3 F (36.8 C)  Min: 98 F (36.7 C)  Max: 98.5 F (36.9 C)  BP 128/76 BP  Min: 110/69  Max: 128/76  HR 91 Pulse  Avg: 90  Min: 80  Max: 96  RR 16 Resp  Avg: 16.3  Min: 15  Max: 18  SaO2   Room Air No data recorded      Gen: alert, cooperative, no distress FHR: Baseline: 130 bpm, Variability: moderate, Accels: Present, Decels: none Toco: occasional  SVE: 1 Dilation: 2 Effacement: 50% Station: -2, ballotable Presentation: Vertex Exam by: Roney Jaffe, CNM   Medications SCHEDULED MEDICATIONS   misoprostol  25 mcg Oral Q4H   And   misoprostol  25 mcg Vaginal Q4H   oxytocin 40 units in LR 1000 mL  333 mL Intravenous Once    MEDICATION INFUSIONS   lactated ringers 500 mL (03/10/23 1553)   lactated ringers 125 mL/hr at 03/10/23 1718   oxytocin     oxytocin 2 milli-units/min (03/10/23 1829)    PRN MEDICATIONS  acetaminophen, fentaNYL (SUBLIMAZE) injection, fentaNYL (SUBLIMAZE) injection, lactated ringers, lidocaine (PF), ondansetron, sodium citrate-citric acid, terbutaline   Assessment & Plan:  22 y.o. G1P0000 at [redacted]w[redacted]d admitted for IOL for obesity, BMI > 30.  -Labor: Early latent labor. -Fetal Well-being: Category I -GBS: negative -Membranes intact -Continue present management; Attempt at Cook's cath and was unsuccessful. 3rd dose of Cytotec to be place as appropriate.  -Analgesia: regional anesthesia at patient's request.    Roney Jaffe, CNM  03/10/2023 6:45 PM   [facility]

## 2023-03-11 ENCOUNTER — Inpatient Hospital Stay: Payer: Medicaid Other | Admitting: Anesthesiology

## 2023-03-11 ENCOUNTER — Encounter: Payer: Self-pay | Admitting: Obstetrics and Gynecology

## 2023-03-11 MED ORDER — LIDOCAINE-EPINEPHRINE (PF) 1.5 %-1:200000 IJ SOLN
INTRAMUSCULAR | Status: DC | PRN
  Administered 2023-03-11: 3 mL via PERINEURAL

## 2023-03-11 MED ORDER — IBUPROFEN 600 MG PO TABS
600.0000 mg | ORAL_TABLET | Freq: Four times a day (QID) | ORAL | Status: DC
  Administered 2023-03-11 – 2023-03-12 (×4): 600 mg via ORAL
  Filled 2023-03-11 (×4): qty 1

## 2023-03-11 MED ORDER — DIPHENHYDRAMINE HCL 25 MG PO CAPS
25.0000 mg | ORAL_CAPSULE | Freq: Four times a day (QID) | ORAL | Status: DC | PRN
Start: 2023-03-11 — End: 2023-03-12

## 2023-03-11 MED ORDER — FERROUS SULFATE 325 (65 FE) MG PO TABS
325.0000 mg | ORAL_TABLET | Freq: Two times a day (BID) | ORAL | Status: DC
  Administered 2023-03-11 – 2023-03-12 (×2): 325 mg via ORAL
  Filled 2023-03-11 (×2): qty 1

## 2023-03-11 MED ORDER — LIDOCAINE HCL (PF) 1 % IJ SOLN
INTRAMUSCULAR | Status: DC | PRN
  Administered 2023-03-11: 3 mL

## 2023-03-11 MED ORDER — DIPHENHYDRAMINE HCL 50 MG/ML IJ SOLN: 12.5000 mg | INTRAMUSCULAR | Status: DC | PRN

## 2023-03-11 MED ORDER — EPHEDRINE 5 MG/ML INJ
10.0000 mg | INTRAVENOUS | Status: DC | PRN
Start: 2023-03-11 — End: 2023-03-11

## 2023-03-11 MED ORDER — COCONUT OIL OIL: 1.0000 | TOPICAL_OIL | Status: DC | PRN

## 2023-03-11 MED ORDER — CARBOPROST TROMETHAMINE 250 MCG/ML IM SOLN
INTRAMUSCULAR | Status: AC
  Filled 2023-03-11: qty 1

## 2023-03-11 MED ORDER — FENTANYL CITRATE (PF) 100 MCG/2ML IJ SOLN
INTRAMUSCULAR | Status: AC
  Administered 2023-03-11: 100 ug via INTRAVENOUS
  Filled 2023-03-11: qty 2

## 2023-03-11 MED ORDER — PHENYLEPHRINE 80 MCG/ML (10ML) SYRINGE FOR IV PUSH (FOR BLOOD PRESSURE SUPPORT): 80.0000 ug | PREFILLED_SYRINGE | INTRAVENOUS | Status: DC | PRN

## 2023-03-11 MED ORDER — FENTANYL-BUPIVACAINE-NACL 0.5-0.125-0.9 MG/250ML-% EP SOLN
EPIDURAL | Status: DC | PRN
  Administered 2023-03-11: 12 mL/h via EPIDURAL

## 2023-03-11 MED ORDER — SENNOSIDES-DOCUSATE SODIUM 8.6-50 MG PO TABS
2.0000 | ORAL_TABLET | Freq: Every day | ORAL | Status: DC
  Administered 2023-03-12: 2 via ORAL
  Filled 2023-03-11: qty 2

## 2023-03-11 MED ORDER — SIMETHICONE 80 MG PO CHEW
80.0000 mg | CHEWABLE_TABLET | ORAL | Status: DC | PRN
Start: 2023-03-11 — End: 2023-03-12

## 2023-03-11 MED ORDER — MISOPROSTOL 200 MCG PO TABS
ORAL_TABLET | ORAL | Status: AC
  Filled 2023-03-11: qty 5

## 2023-03-11 MED ORDER — FENTANYL CITRATE (PF) 100 MCG/2ML IJ SOLN
INTRAMUSCULAR | Status: AC
  Filled 2023-03-11: qty 2

## 2023-03-11 MED ORDER — EPHEDRINE 5 MG/ML INJ
10.0000 mg | INTRAVENOUS | Status: DC | PRN
  Filled 2023-03-11: qty 5

## 2023-03-11 MED ORDER — ONDANSETRON HCL 4 MG PO TABS
4.0000 mg | ORAL_TABLET | ORAL | Status: DC | PRN
Start: 2023-03-11 — End: 2023-03-12

## 2023-03-11 MED ORDER — ACETAMINOPHEN 325 MG PO TABS
650.0000 mg | ORAL_TABLET | ORAL | Status: DC | PRN
Start: 2023-03-11 — End: 2023-03-12
  Administered 2023-03-11: 650 mg via ORAL
  Filled 2023-03-11: qty 2

## 2023-03-11 MED ORDER — PHENYLEPHRINE 80 MCG/ML (10ML) SYRINGE FOR IV PUSH (FOR BLOOD PRESSURE SUPPORT)
80.0000 ug | PREFILLED_SYRINGE | INTRAVENOUS | Status: DC | PRN
  Filled 2023-03-11: qty 10

## 2023-03-11 MED ORDER — BENZOCAINE-MENTHOL 20-0.5 % EX AERO
1.0000 | INHALATION_SPRAY | CUTANEOUS | Status: DC | PRN
  Administered 2023-03-11: 1 via TOPICAL
  Filled 2023-03-11 (×2): qty 56

## 2023-03-11 MED ORDER — OXYCODONE HCL 5 MG PO TABS: 10.0000 mg | ORAL_TABLET | ORAL | Status: DC | PRN

## 2023-03-11 MED ORDER — OXYCODONE HCL 5 MG PO TABS: 5.0000 mg | ORAL_TABLET | ORAL | Status: DC | PRN

## 2023-03-11 MED ORDER — LACTATED RINGERS IV SOLN: INTRAVENOUS | Status: DC

## 2023-03-11 MED ORDER — TETANUS-DIPHTH-ACELL PERTUSSIS 5-2.5-18.5 LF-MCG/0.5 IM SUSY
0.5000 mL | PREFILLED_SYRINGE | Freq: Once | INTRAMUSCULAR | Status: DC
  Filled 2023-03-11: qty 0.5

## 2023-03-11 MED ORDER — LIDOCAINE-EPINEPHRINE (PF) 1.5 %-1:200000 IJ SOLN
INTRAMUSCULAR | Status: DC | PRN
  Administered 2023-03-11: 3 mL via EPIDURAL

## 2023-03-11 MED ORDER — LACTATED RINGERS IV SOLN
500.0000 mL | Freq: Once | INTRAVENOUS | Status: AC
  Administered 2023-03-11: 500 mL via INTRAVENOUS

## 2023-03-11 MED ORDER — FENTANYL-BUPIVACAINE-NACL 0.5-0.125-0.9 MG/250ML-% EP SOLN
EPIDURAL | Status: AC
  Filled 2023-03-11: qty 250

## 2023-03-11 MED ORDER — METHYLERGONOVINE MALEATE 0.2 MG/ML IJ SOLN
INTRAMUSCULAR | Status: AC
  Filled 2023-03-11: qty 1

## 2023-03-11 MED ORDER — FENTANYL-BUPIVACAINE-NACL 0.5-0.125-0.9 MG/250ML-% EP SOLN
12.0000 mL/h | EPIDURAL | Status: DC | PRN
  Filled 2023-03-11: qty 250

## 2023-03-11 MED ORDER — WITCH HAZEL-GLYCERIN EX PADS
1.0000 | MEDICATED_PAD | CUTANEOUS | Status: DC | PRN
  Administered 2023-03-11: 1 via TOPICAL
  Filled 2023-03-11 (×2): qty 100

## 2023-03-11 MED ORDER — PRENATAL MULTIVITAMIN CH
1.0000 | ORAL_TABLET | Freq: Every day | ORAL | Status: DC
  Administered 2023-03-12: 1 via ORAL
  Filled 2023-03-11: qty 1

## 2023-03-11 MED ORDER — DIBUCAINE (PERIANAL) 1 % EX OINT
1.0000 | TOPICAL_OINTMENT | CUTANEOUS | Status: DC | PRN
  Administered 2023-03-11: 1 via RECTAL
  Filled 2023-03-11: qty 28

## 2023-03-11 MED ORDER — BUPIVACAINE HCL (PF) 0.25 % IJ SOLN
INTRAMUSCULAR | Status: DC | PRN
  Administered 2023-03-11: 8 mL via EPIDURAL
  Administered 2023-03-11: 5 mL via EPIDURAL

## 2023-03-11 MED ORDER — TRANEXAMIC ACID-NACL 1000-0.7 MG/100ML-% IV SOLN
INTRAVENOUS | Status: AC
  Filled 2023-03-11: qty 100

## 2023-03-11 MED ORDER — ONDANSETRON HCL 4 MG/2ML IJ SOLN: 4.0000 mg | INTRAMUSCULAR | Status: DC | PRN

## 2023-03-11 NOTE — Anesthesia Procedure Notes (Signed)
Epidural Patient location during procedure: OB Start time: 03/11/2023 12:32 AM End time: 03/11/2023 1:00 AM  Staffing Anesthesiologist: Louie Boston, MD Performed: anesthesiologist   Preanesthetic Checklist Completed: patient identified, IV checked, site marked, risks and benefits discussed, surgical consent, monitors and equipment checked, pre-op evaluation and timeout performed  Epidural Patient position: sitting Prep: ChloraPrep Patient monitoring: heart rate, continuous pulse ox and blood pressure Approach: midline Location: L3-L4 Injection technique: LOR saline  Needle:  Needle type: Tuohy  Needle gauge: 17 G Needle length: 15 cm and 9 Needle insertion depth: 13.5 cm Catheter type: closed end flexible Catheter size: 19 Gauge Catheter at skin depth: 19 cm Test dose: negative and 1.5% lidocaine with Epi 1:200 K  Assessment Sensory level: T10 Events: blood not aspirated, injection not painful, no injection resistance, no paresthesia and negative IV test  Additional Notes 2 attempt Pt. Evaluated and documentation done after procedure finished. Patient identified. Risks/Benefits/Options discussed with patient including but not limited to bleeding, infection, nerve damage, paralysis, failed block, incomplete pain control, headache, blood pressure changes, nausea, vomiting, reactions to medication both or allergic, itching and postpartum back pain. Confirmed with bedside nurse the patient's most recent platelet count. Confirmed with patient that they are not currently taking any anticoagulation, have any bleeding history or any family history of bleeding disorders. Patient expressed understanding and wished to proceed. All questions were answered. Sterile technique was used throughout the entire procedure. Please see nursing notes for vital signs. Test dose was given through epidural catheter and negative prior to continuing to dose epidural or start infusion. Warning signs of  high block given to the patient including shortness of breath, tingling/numbness in hands, complete motor block, or any concerning symptoms with instructions to call for help. Patient was given instructions on fall risk and not to get out of bed. All questions and concerns addressed with instructions to call with any issues or inadequate analgesia.    Patient tolerated the insertion well without immediate complications. Reason for block:procedure for pain

## 2023-03-11 NOTE — Progress Notes (Signed)
L&D Note    Subjective:  She is resting comfortably in bed on left side. Reports mild cramping. States she can feel pressure, but it is not painful.   Objective:   Vitals:   03/11/23 0330 03/11/23 0400 03/11/23 0430 03/11/23 0500  BP: 127/81 127/89 (!) 106/54 (!) 109/51  Pulse: (!) 101 (!) 105 90 88  Resp:    18  Temp:    98.3 F (36.8 C)  TempSrc:    Oral  SpO2: 96% 95% 96% 95%  Weight:      Height:        Current Vital Signs 24h Vital Sign Ranges  T 98.3 F (36.8 C) Temp  Avg: 98.3 F (36.8 C)  Min: 98 F (36.7 C)  Max: 98.8 F (37.1 C)  BP (!) 109/51 BP  Min: 96/53  Max: 130/72  HR 88 Pulse  Avg: 94.5  Min: 80  Max: 105  RR 18 Resp  Avg: 16.6  Min: 15  Max: 18  SaO2 95 % Room Air SpO2  Avg: 96.1 %  Min: 95 %  Max: 99 %      Gen: alert, cooperative, no distress FHR: Baseline: 130 bpm, Variability: moderate, Accels: Present, Decels: none Toco: toco adjusted  SVE: Dilation: 4 Effacement (%): 60 Cervical Position: Posterior Station: -3 Presentation: Vertex Exam by:: Burhan Barham, CNM  Medications SCHEDULED MEDICATIONS   misoprostol  25 mcg Oral Q4H   And   misoprostol  25 mcg Vaginal Q4H   oxytocin 40 units in LR 1000 mL  333 mL Intravenous Once    MEDICATION INFUSIONS   fentaNYL 2 mcg/mL w/bupivacaine 0.125% in NS 250 mL     oxytocin     oxytocin 6 milli-units/min (03/10/23 2210)    PRN MEDICATIONS  acetaminophen, diphenhydrAMINE, ePHEDrine, ePHEDrine, fentaNYL (SUBLIMAZE) injection, fentaNYL (SUBLIMAZE) injection, fentaNYL 2 mcg/mL w/bupivacaine 0.125% in NS 250 mL, lidocaine (PF), ondansetron, phenylephrine, phenylephrine, sodium citrate-citric acid, terbutaline   Assessment & Plan:  22 y.o. G1P0000 at [redacted]w[redacted]d admitted for IOL for obesity, BMI> 30. -Labor:  Cook's catheter manually deflated/removed approximately around 0515 due to approaching 12-hour mark. Pitocin currently infusing at 6 milliUnits/min. Repositioned to right side with peanut ball and toco adjusted.  Mom and fetus remain stable.  -Fetal Well-being: Category I -GBS: negative -Membranes intact -Intervention: Increase Pitocin rate as appropriate and use of peanut ball with frequent position change as appropriate.  -Analgesia: regional anesthesia in place   Roney Jaffe, CNM  03/11/2023 5:24 AM  [facility]

## 2023-03-11 NOTE — Progress Notes (Signed)
Labor Progress Note  Sonya Sanders is a 22 y.o. G1P0000 at [redacted]w[redacted]d by LMP admitted for induction of labor due to obesity.  Subjective: Pt is comfortable with epidural  Objective: BP 111/76   Pulse 99   Temp 98.2 F (36.8 C) (Oral)   Resp 18   Ht 5\' 7"  (1.702 m)   Wt 116.1 kg   LMP 06/03/2022   SpO2 100%   BMI 40.10 kg/m    Fetal Assessment: FHT:  FHR: 140 bpm, variability: minimal - moderate,  accelerations:  Present,  decelerations:  Absent Category/reactivity:  Category I UC:   regular, every 3 minutes SVE:    Dilation: 5cm  Effacement: 80%  Station:  -2  Consistency: soft  Position: middle  Membrane status: AROM at 0647 Amniotic color: Clear  Labs: Lab Results  Component Value Date   WBC 14.3 (H) 03/10/2023   HGB 13.0 03/10/2023   HCT 39.8 03/10/2023   MCV 83.3 03/10/2023   PLT 236 03/10/2023    Assessment / Plan: Induction of labor due to obesity,  progressing well on pitocin 0515 4/60/-3  Cook Cath deflated and removed 0712 5/80/-2 0907 Pitocin has been at 6mU since 2210 last night 1000 IUPC placed Pitocin currently at 8mU, MVUs at ~90  Labor: Progressing normally Preeclampsia:   111/76 Fetal Wellbeing:  Category I Pain Control:  Epidural I/D:   Afebrile, GBS neg, AROM x 3 hrs Anticipated MOD:  NSVD  Cyril Mourning, CNM 03/11/2023, 10:19 AM

## 2023-03-11 NOTE — Progress Notes (Signed)
Labor Progress Note  Sonya Sanders is a 22 y.o. G1P0000 at [redacted]w[redacted]d by LMP admitted for induction of labor due to obesity.  Subjective: Pt is comfortable with epidural  Objective: BP 117/69   Pulse (!) 106   Temp 98.2 F (36.8 C) (Oral)   Resp 18   Ht 5\' 7"  (1.702 m)   Wt 116.1 kg   LMP 06/03/2022   SpO2 97%   BMI 40.10 kg/m    Fetal Assessment: FHT:  FHR: 130 bpm, variability: minimal ,  accelerations:  Present,  decelerations:  Absent Category/reactivity:  Category I UC:   regular, every 3-5 minutes SVE:    Dilation: 5cm  Effacement: 80%  Station:  -2  Consistency: soft  Position: middle  Membrane status: AROM at 0647 Amniotic color: Clear  Labs: Lab Results  Component Value Date   WBC 14.3 (H) 03/10/2023   HGB 13.0 03/10/2023   HCT 39.8 03/10/2023   MCV 83.3 03/10/2023   PLT 236 03/10/2023    Assessment / Plan: Induction of labor due to obesity,  progressing well on pitocin 1414 received 4 dose of Cytotec 1715 1/60/high 1813 2/60/-3 1829 Pitocin started  0100 Epidural placed 0515 4/60/-3  Cook Cath deflated and removed 4034 5/80/-2 0907 Pitocin has been at 6mU since 2210 last night  Labor: Progressing normally Preeclampsia:   117/69 Fetal Wellbeing:  Category I Pain Control:  Epidural I/D:   Afebrile, GBS neg, AROM x 2 hrs Anticipated MOD:  NSVD  Cyril Mourning, CNM 03/11/2023, 8:58 AM

## 2023-03-11 NOTE — Progress Notes (Signed)
Patient ID: Sonya Sanders, female   DOB: May 08, 2000, 22 y.o.   MRN: [id number] Updated on status of mother by RN.. I have reviewed fetal strip.

## 2023-03-11 NOTE — Progress Notes (Signed)
L&D Note    Subjective:  Patient states she is "hurting really bad" and desires an epidural at this time.   Objective:   Vitals:   03/10/23 0706 03/10/23 1155 03/10/23 1709 03/10/23 2210  BP: 110/69 113/70 128/76 120/75  Pulse: 80 93 91 93  Resp: 15 16 16 18   Temp: 98.2 F (36.8 C) 98 F (36.7 C) 98.4 F (36.9 C) 98.8 F (37.1 C)  TempSrc: Oral Oral Oral Oral  Weight:      Height:        Current Vital Signs 24h Vital Sign Ranges  T 98.8 F (37.1 C) Temp  Avg: 98.4 F (36.9 C)  Min: 98 F (36.7 C)  Max: 98.8 F (37.1 C)  BP 120/75 BP  Min: 110/69  Max: 128/76  HR 93 Pulse  Avg: 90.6  Min: 80  Max: 96  RR 18 Resp  Avg: 16.6  Min: 15  Max: 18  SaO2   Room Air No data recorded      Gen: alert, cooperative, no distress FHR: Baseline: 130 bpm, Variability: moderate, Accels: Present, Decels: none Toco: occasional  SVE: Deferred; Cook's Cath in place.   Medications SCHEDULED MEDICATIONS   misoprostol  25 mcg Oral Q4H   And   misoprostol  25 mcg Vaginal Q4H   oxytocin 40 units in LR 1000 mL  333 mL Intravenous Once    MEDICATION INFUSIONS   lactated ringers 125 mL/hr at 03/10/23 1718   oxytocin     oxytocin 6 milli-units/min (03/10/23 2210)    PRN MEDICATIONS  acetaminophen, fentaNYL (SUBLIMAZE) injection, fentaNYL (SUBLIMAZE) injection, lidocaine (PF), ondansetron, sodium citrate-citric acid, terbutaline   Assessment & Plan:  22 y.o. G1P0000 at [redacted]w[redacted]d admitted for IOL for obesity, BMI> 30. -Labor: Cook's Cath in place currently. Pitocin currently infusing at 6 milliUnits/min.  -Fetal Well-being: Category I -GBS: negative -Membranes intact -Intervention: Increase Pitocin rate as appropriate and change maternal position as needed.  -Analgesia: Patient request regional anesthesia. Anesthesia en route to bedside.    Roney Jaffe, CNM  03/11/2023 12:17 AM  [facility]

## 2023-03-11 NOTE — Discharge Summary (Signed)
Postpartum Discharge Summary  Patient Name: Sonya Sanders DOB: 2000-09-29 MRN: [id number]  Date of admission: 03/10/2023 Delivery date:03/11/2023 Delivering provider: Haroldine Laws Date of discharge: 03/12/2023  Primary OB: [facility] [id number] last menstrual period was 06/03/2022. EDC Estimated Date of Delivery: 03/10/23 Gestational Age at Delivery: [redacted]w[redacted]d   Admitting diagnosis: Encounter for induction of labor [Z34.90] Intrauterine pregnancy: [redacted]w[redacted]d     Secondary diagnosis:   Principal Problem:   NSVD (normal spontaneous vaginal delivery) Active Problems:   Encounter for induction of labor   Discharge Diagnosis: Term Pregnancy Delivered and Obesity        Hospital course: Induction of Labor With Vaginal Delivery   22 y.o. yo G1P1001 at [redacted]w[redacted]d was admitted to the hospital 03/10/2023 for induction of labor.  Indication for induction:  obesity .  Patient had an labor course complicated by none. Membrane Rupture Time/Date: 6:30 AM,03/11/2023  Delivery Method:Vaginal, Spontaneous Operative Delivery:N/A Episiotomy: None Lacerations:  1st degree;Labial Details of delivery can be found in separate delivery note.  Patient had a postpartum course complicated by none. Patient is discharged home 03/12/23.  Newborn Data: Birth date:03/11/2023 Birth time:1:21 PM Gender:Female Living status:Living Apgars:7 ,9  Weight:3480 g                                            Post partum procedures: none Induction:: AROM, Pitocin, Cytotec, and IP Foley Complications: None Delivery Type: spontaneous vaginal delivery Anesthesia: epidural anesthesia Placenta: spontaneous To Pathology: Yes   Prenatal Labs:  Blood type/Rh A positive  Antibody screen neg  Rubella Immune  Varicella Immune  RPR NR  HBsAg Neg  HIV NR  GC neg  Chlamydia neg  Genetic screening cfDNA negative   1 hour GTT 171  3 hour GTT 79, 178, 157, 124   GBS Negative    Magnesium Sulfate received:  No BMZ received: No Rhophylac:was not indicated MMR: was not indicated Varivax vaccine given: was not indicated T-DaP:Given prenatally Flu: Given prenatally  Transfusion:No  Physical exam  Vitals:   03/11/23 2000 03/11/23 2345 03/12/23 0310 03/12/23 0733  BP: 119/73 121/77 106/73 90/67  Pulse: 83 (!) 102 81 75  Resp: 18 20 18 20   Temp: 98.1 F (36.7 C) 97.8 F (36.6 C) 97.8 F (36.6 C) 97.8 F (36.6 C)  TempSrc: Oral Oral Oral Oral  SpO2: 100% 98% 100% 99%  Weight:      Height:       General: alert, cooperative, and no distress Lochia: appropriate Uterine Fundus: firm Perineum:minimal edema/repair well approximated Incision: n/a DVT Evaluation: No evidence of DVT seen on physical exam.  Labs: Lab Results  Component Value Date   WBC 15.6 (H) 03/12/2023   HGB 11.3 (L) 03/12/2023   HCT 35.4 (L) 03/12/2023   MCV 84.9 03/12/2023   PLT 200 03/12/2023      Latest Ref Rng & Units 11/24/2021    3:37 PM  CMP  Glucose 70 - 99 mg/dL 89   BUN 6 - 20 mg/dL 11   Creatinine 9.52 - 1.00 mg/dL 8.41   Sodium 324 - 401 mmol/L 138   Potassium 3.5 - 5.1 mmol/L 3.9   Chloride 98 - 111 mmol/L 104   CO2 22 - 32 mmol/L 26   Calcium 8.9 - 10.3 mg/dL 9.2   Total Protein 6.5 - 8.1 g/dL 8.0   Total Bilirubin 0.3 - 1.2  mg/dL 0.7   Alkaline Phos 38 - 126 U/L 42   AST 15 - 41 U/L 23   ALT 0 - 44 U/L 18    Edinburgh Score:    03/11/2023    4:00 PM  Edinburgh Postnatal Depression Scale Screening Tool  I have been able to laugh and see the funny side of things. 0  I have looked forward with enjoyment to things. 0  I have blamed myself unnecessarily when things went wrong. 1  I have been anxious or worried for no good reason. 0  I have felt scared or panicky for no good reason. 0  Things have been getting on top of me. 0  I have been so unhappy that I have had difficulty sleeping. 0  I have felt sad or miserable. 0  I have been so unhappy that I have been crying. 1  The thought of  harming myself has occurred to me. 0  Edinburgh Postnatal Depression Scale Total 2    Risk assessment for postpartum VTE and prophylactic treatment: Very high risk factors: None High risk factors: BMI 40-50 kg/m2 Moderate risk factors: None  Postpartum VTE prophylaxis with LMWH not indicated  After visit meds:  Allergies as of 03/12/2023   No Known Allergies      Medication List     STOP taking these medications    benzonatate 100 MG capsule Commonly known as: TESSALON   cetirizine 10 MG tablet Commonly known as: ZyrTEC Allergy   naproxen sodium 220 MG tablet Commonly known as: ALEVE   ondansetron 4 MG disintegrating tablet Commonly known as: ZOFRAN-ODT   promethazine-dextromethorphan 6.25-15 MG/5ML syrup Commonly known as: PROMETHAZINE-DM   pseudoephedrine 60 MG tablet Commonly known as: SUDAFED       TAKE these medications    acetaminophen 325 MG tablet Commonly known as: Tylenol Take 2 tablets (650 mg total) by mouth every 6 (six) hours as needed (for pain scale < 4).   ibuprofen 600 MG tablet Commonly known as: ADVIL Take 1 tablet (600 mg total) by mouth every 6 (six) hours as needed.   prenatal multivitamin Tabs tablet Take 1 tablet by mouth daily at 12 noon.       Discharge home in stable condition Infant Feeding: Breast Infant Disposition:home with mother Discharge instruction: per After Visit Summary and Postpartum booklet. Activity: Advance as tolerated. Pelvic rest for 6 weeks.  Diet: routine diet Anticipated Birth Control: OCPs Postpartum Appointment:6 weeks Additional Postpartum F/U: Postpartum Depression checkup Future Appointments:No future appointments. Follow up Visit:  Follow-up Information     Haroldine Laws, CNM. Schedule an appointment as soon as possible for a visit in 1 week(s).   Specialty: Certified Nurse Midwife Why: for a mood check Contact information: [address] [place]  [address] [phone]                 Plan:  Sonya Sanders was discharged to home in good condition. Follow-up appointment as directed.    Signed: Cyril Mourning 03/12/2023 11:45 AM

## 2023-03-11 NOTE — Progress Notes (Addendum)
L&D Note    Subjective:  No unusual concerns.   Objective:   Vitals:   03/11/23 0330 03/11/23 0400 03/11/23 0430 03/11/23 0500  BP: 127/81 127/89 (!) 106/54 (!) 109/51  Pulse: (!) 101 (!) 105 90 88  Resp:    18  Temp:    98.3 F (36.8 C)  TempSrc:    Oral  SpO2: 96% 95% 96% 95%  Weight:      Height:        Current Vital Signs 24h Vital Sign Ranges  T 98.3 F (36.8 C) Temp  Avg: 98.3 F (36.8 C)  Min: 98 F (36.7 C)  Max: 98.8 F (37.1 C)  BP (!) 109/51 BP  Min: 96/53  Max: 130/72  HR 88 Pulse  Avg: 94.5  Min: 80  Max: 105  RR 18 Resp  Avg: 16.6  Min: 15  Max: 18  SaO2 95 % Room Air SpO2  Avg: 96.1 %  Min: 95 %  Max: 99 %      Gen: alert, cooperative, no distress FHR: Baseline: 130 bpm, Variability: moderate, Accels: Present, Decels: none Toco: toco adjusted; IUPC placed around 0640, occasional x 2 contractions  SVE: Dilation: 4 Effacement (%): 60 Cervical Position: Posterior Station: -2 Presentation: Vertex Exam by:: Analiah Drum, CNM  Medications SCHEDULED MEDICATIONS   misoprostol  25 mcg Oral Q4H   And   misoprostol  25 mcg Vaginal Q4H   oxytocin 40 units in LR 1000 mL  333 mL Intravenous Once    MEDICATION INFUSIONS   fentaNYL 2 mcg/mL w/bupivacaine 0.125% in NS 250 mL     oxytocin     oxytocin 6 milli-units/min (03/10/23 2210)    PRN MEDICATIONS  acetaminophen, diphenhydrAMINE, ePHEDrine, ePHEDrine, fentaNYL (SUBLIMAZE) injection, fentaNYL (SUBLIMAZE) injection, fentaNYL 2 mcg/mL w/bupivacaine 0.125% in NS 250 mL, lidocaine (PF), ondansetron, phenylephrine, phenylephrine, sodium citrate-citric acid, terbutaline   Assessment & Plan:  22 y.o. G1P0000 at [redacted]w[redacted]d admitted for IOL for obesity, BMI>30 -Labor: Inadequate uterine activity - intensity or frequency. IUPC placed for better assessment of frequency and intensity of uterine contractions. Pitocin currently infusing at 6 milliUnits/min. Mom and fetus in stable condition.  -Fetal Well-being: Category I -GBS:  negative -AROM @ 0630, clear  -Intervention: Increase Pitocin rate as appropriate and use of peanut ball with frequent maternal position changes appropriate. IUPC placement discussed with patient. Patient verbalized understanding and agreed with placement. IUPC placed approximately around 0640.  -Analgesia: regional anesthesia in place   Roney Jaffe, CNM  03/11/2023 6:48 AM  [facility]

## 2023-03-11 NOTE — Plan of Care (Signed)
  Problem: Education: Goal: Knowledge of Childbirth will improve 03/11/2023 1648 by Rolene Arbour I, RN Outcome: Completed/Met 03/11/2023 0757 by Rolene Arbour I, RN Outcome: Progressing 03/11/2023 0756 by Rolene Arbour I, RN Outcome: Progressing Goal: Ability to make informed decisions regarding treatment and plan of care will improve 03/11/2023 1648 by Rolene Arbour I, RN Outcome: Completed/Met 03/11/2023 0757 by Rolene Arbour I, RN Outcome: Progressing 03/11/2023 0756 by Rolene Arbour I, RN Outcome: Progressing Goal: Ability to state and carry out methods to decrease the pain will improve 03/11/2023 1648 by Rolene Arbour I, RN Outcome: Completed/Met 03/11/2023 0757 by Rolene Arbour I, RN Outcome: Progressing 03/11/2023 0756 by Rolene Arbour I, RN Outcome: Progressing Goal: Individualized Educational Video(s) 03/11/2023 1648 by Rolene Arbour I, RN Outcome: Completed/Met 03/11/2023 0757 by Rolene Arbour I, RN Outcome: Progressing 03/11/2023 0756 by Rolene Arbour I, RN Outcome: Progressing   Problem: Coping: Goal: Ability to verbalize concerns and feelings about labor and delivery will improve 03/11/2023 1648 by Rolene Arbour I, RN Outcome: Completed/Met 03/11/2023 0757 by Rolene Arbour I, RN Outcome: Progressing   Problem: Life Cycle: Goal: Ability to make normal progression through stages of labor will improve 03/11/2023 1648 by Rolene Arbour I, RN Outcome: Completed/Met 03/11/2023 0757 by Rolene Arbour I, RN Outcome: Progressing Goal: Ability to effectively push during vaginal delivery will improve 03/11/2023 1648 by Rolene Arbour I, RN Outcome: Completed/Met 03/11/2023 0757 by Rolene Arbour I, RN Outcome: Progressing   Problem: Role Relationship: Goal: Will demonstrate positive interactions with the child 03/11/2023 1648 by Rolene Arbour I, RN Outcome: Completed/Met 03/11/2023 0757 by Rolene Arbour I, RN Outcome:  Progressing   Problem: Safety: Goal: Risk of complications during labor and delivery will decrease 03/11/2023 1648 by Rolene Arbour I, RN Outcome: Completed/Met 03/11/2023 0757 by Rolene Arbour I, RN Outcome: Progressing   Problem: Pain Management: Goal: Relief or control of pain from uterine contractions will improve 03/11/2023 1648 by Rolene Arbour I, RN Outcome: Completed/Met 03/11/2023 0757 by Rolene Arbour I, RN Outcome: Progressing   Problem: Education: Goal: Knowledge of condition will improve Outcome: Completed/Met Goal: Individualized Educational Video(s) Outcome: Completed/Met Goal: Individualized Newborn Educational Video(s) Outcome: Completed/Met   Problem: Activity: Goal: Will verbalize the importance of balancing activity with adequate rest periods Outcome: Completed/Met Goal: Ability to tolerate increased activity will improve Outcome: Completed/Met   Problem: Coping: Goal: Ability to identify and utilize available resources and services will improve Outcome: Completed/Met   Problem: Life Cycle: Goal: Chance of risk for complications during the postpartum period will decrease Outcome: Completed/Met   Problem: Role Relationship: Goal: Ability to demonstrate positive interaction with newborn will improve Outcome: Completed/Met   Problem: Skin Integrity: Goal: Demonstration of wound healing without infection will improve Outcome: Completed/Met

## 2023-03-11 NOTE — Anesthesia Procedure Notes (Signed)
Epidural Patient location during procedure: OB Start time: 03/11/2023 8:18 AM End time: 03/11/2023 8:29 AM  Staffing Anesthesiologist: Reed Breech, MD Performed: anesthesiologist   Preanesthetic Checklist Completed: patient identified, IV checked, risks and benefits discussed, surgical consent, monitors and equipment checked, pre-op evaluation and timeout performed  Epidural Patient position: sitting Prep: Betadine Patient monitoring: heart rate, continuous pulse ox and blood pressure Approach: midline Location: L2-L3 Injection technique: LOR air  Needle:  Needle type: Tuohy  Needle gauge: 17 G Needle length: 9 cm Needle insertion depth: 7 cm Catheter at skin depth: 12 cm Test dose: negative and 1.5% lidocaine with Epi 1:200 K  Assessment Sensory level: T4  Additional Notes Patient with apparent leftward scoliosis, otherwise straightforward placement without apparent complications.Reason for block:procedure for pain

## 2023-03-11 NOTE — Progress Notes (Signed)
Pt brought and oriented to PP mother baby room 351.no distress noted, pt holding infant in arms, id bands matched. Assessment done, iv SL. Pt request no new needs at this time.

## 2023-03-11 NOTE — Anesthesia Preprocedure Evaluation (Signed)
Anesthesia Evaluation  Patient identified by MRN, date of birth, ID band Patient awake    Reviewed: Allergy & Precautions, NPO status , Patient's Chart, lab work & pertinent test results  History of Anesthesia Complications Negative for: history of anesthetic complications  Airway Mallampati: III  TM Distance: >3 FB Neck ROM: full    Dental no notable dental hx.    Pulmonary neg pulmonary ROS   Pulmonary exam normal        Cardiovascular Exercise Tolerance: Good negative cardio ROS Normal cardiovascular exam     Neuro/Psych    GI/Hepatic negative GI ROS,,,  Endo/Other    Class 3 obesity  Renal/GU   negative genitourinary   Musculoskeletal   Abdominal   Peds  Hematology negative hematology ROS (+)   Anesthesia Other Findings Past Medical History: No date: Anxiety     Comment:  as a teenager No date: Depression     Comment:  as a teenager  Past Surgical History: 2020: WRIST FRACTURE SURGERY; Right  BMI    Body Mass Index: 40.10 kg/m      Reproductive/Obstetrics (+) Pregnancy                             Anesthesia Physical Anesthesia Plan  ASA: 3  Anesthesia Plan: Epidural   Post-op Pain Management:    Induction:   PONV Risk Score and Plan:   Airway Management Planned: Natural Airway  Additional Equipment:   Intra-op Plan:   Post-operative Plan:   Informed Consent: I have reviewed the patients History and Physical, chart, labs and discussed the procedure including the risks, benefits and alternatives for the proposed anesthesia with the patient or authorized representative who has indicated his/her understanding and acceptance.     Dental Advisory Given  Plan Discussed with: Anesthesiologist  Anesthesia Plan Comments: (Patient reports no bleeding problems and no anticoagulant use.   Patient consented for risks of anesthesia including but not limited to:   - adverse reactions to medications - risk of bleeding, infection and or nerve damage from epidural that could lead to paralysis - risk of headache or failed epidural - nerve damage due to positioning - that if epidural is used for C-section that there is a chance of epidural failure requiring spinal placement or conversion to GA - Damage to heart, brain, lungs, other parts of body or loss of life  Patient voiced understanding and assent.)       Anesthesia Quick Evaluation

## 2023-03-11 NOTE — Lactation Note (Signed)
This note was copied from a baby's chart. Lactation Consultation Note  Patient Name: Sonya Sanders [id number] Date: 03/11/2023 Age:23 hours Reason for consult: Primapara;Term;L&D Initial assessment   Maternal Data Has patient been taught Hand Expression?: Yes Does the patient have breastfeeding experience prior to this delivery?: No  Initial assessment w/ a P1 patient and 1hr old baby Sonya "Sophia".  This was a SVD w/ fast 2nd stage delivery.  Patient w/ a hx of anxiety.  Feeding goal is breastfeeding.   Patient stated that she does have a DEBP at home.   Feeding Mother's Current Feeding Choice: Breast Milk  A feeding at the breast was attempted while LC was present.  LC provided education on how to position infant at the breast, sandwich breast and bring infant to the breast.  Infant latched did a couple of suckles and releases breast.  Several attempts were made at the breast.    LATCH Score Latch: Repeated attempts needed to sustain latch, nipple held in mouth throughout feeding, stimulation needed to elicit sucking reflex.  Audible Swallowing: A few with stimulation  Type of Nipple: Everted at rest and after stimulation  Comfort (Breast/Nipple): Soft / non-tender  Hold (Positioning): Assistance needed to correctly position infant at breast and maintain latch.  LATCH Score: 7  Interventions Interventions: Assisted with latch;Skin to skin;Breast massage;Breast feeding basics reviewed;Support pillows;Position options  LC provided education on the following;  milk production expectations in the first 24hrs, hunger cues, day 1/2 wet/dirty diapers, hand expression, benefits of STS and arousing infant for a feeding.  Lactation informed patient of feeding infant at least 8 or more times w/in a 24hr period but not exceeding 3hrs. Patient verbalized understanding.   Discharge Pump: DEBP;Personal WIC Program: No  Consult Status Consult Status: Follow-up Follow-up type:  In-patient    Yvette Rack Jazmarie Biever 03/11/2023, 3:32 PM

## 2023-03-11 NOTE — Plan of Care (Signed)
  Problem: Education: Goal: Knowledge of Childbirth will improve 03/11/2023 0757 by Rolene Arbour I, RN Outcome: Progressing 03/11/2023 0756 by Rolene Arbour I, RN Outcome: Progressing Goal: Ability to make informed decisions regarding treatment and plan of care will improve 03/11/2023 0757 by Rolene Arbour I, RN Outcome: Progressing 03/11/2023 0756 by Rolene Arbour I, RN Outcome: Progressing Goal: Ability to state and carry out methods to decrease the pain will improve 03/11/2023 0757 by Rolene Arbour I, RN Outcome: Progressing 03/11/2023 0756 by Rolene Arbour I, RN Outcome: Progressing Goal: Individualized Educational Video(s) 03/11/2023 0757 by Rolene Arbour I, RN Outcome: Progressing 03/11/2023 0756 by Rolene Arbour I, RN Outcome: Progressing   Problem: Coping: Goal: Ability to verbalize concerns and feelings about labor and delivery will improve Outcome: Progressing   Problem: Life Cycle: Goal: Ability to make normal progression through stages of labor will improve Outcome: Progressing Goal: Ability to effectively push during vaginal delivery will improve Outcome: Progressing   Problem: Role Relationship: Goal: Will demonstrate positive interactions with the child Outcome: Progressing   Problem: Safety: Goal: Risk of complications during labor and delivery will decrease Outcome: Progressing   Problem: Pain Management: Goal: Relief or control of pain from uterine contractions will improve Outcome: Progressing

## 2023-03-12 LAB — CBC
HCT: 35.4 % — ABNORMAL LOW (ref 36.0–46.0)
Hemoglobin: 11.3 g/dL — ABNORMAL LOW (ref 12.0–15.0)
MCH: 27.1 pg (ref 26.0–34.0)
MCHC: 31.9 g/dL (ref 30.0–36.0)
MCV: 84.9 fL (ref 80.0–100.0)
Platelets: 200 10*3/uL (ref 150–400)
RBC: 4.17 MIL/uL (ref 3.87–5.11)
RDW: 14.8 % (ref 11.5–15.5)
WBC: 15.6 10*3/uL — ABNORMAL HIGH (ref 4.0–10.5)
nRBC: 0 % (ref 0.0–0.2)

## 2023-03-12 MED ORDER — PRENATAL MULTIVITAMIN CH: 1.0000 | ORAL_TABLET | Freq: Every day | ORAL | Status: AC

## 2023-03-12 MED ORDER — IBUPROFEN 600 MG PO TABS: 600.0000 mg | ORAL_TABLET | Freq: Four times a day (QID) | ORAL | Status: AC | PRN

## 2023-03-12 MED ORDER — ACETAMINOPHEN 325 MG PO TABS: 650.0000 mg | ORAL_TABLET | Freq: Four times a day (QID) | ORAL | Status: AC | PRN

## 2023-03-12 NOTE — Discharge Instructions (Signed)
Call office if you have any of the following:  -Persistent headache or visual changes (possible high blood pressure) -Fever >101.0 F or chills (possible infection) -Breast concerns (engorgement, mastitis) -Excessive vaginal bleeding (soaking through more than one pad in 1 hr x 2 hr) -Incision drainage/redness/increased pain/warmth at site (possible infection)  -Leg pain or redness (possible blood clot) -Depression/anxiety increased symptoms 2 weeks after delivery  Activity & Hygiene: -Do not lift > 10 lbs for 6 weeks.  -No intercourse or tampons for 6 weeks.  -No swimming pools, hot tubs or tub baths- showers only for 6 weeks **No driving for 1-2 weeks or while taking pain medication after c-section  -It is normal to bleed for up to 6 weeks. You should not soak through more than 1 pad in 1 hour x 2 hours.  Breastfeeding: -Continue prenatal vitamin.  -Increase calories and fluids.  -Your milk will come in, in the next couple of days (right now it is colostrum).  -You may have a slight fever when your milk comes in, but it should go away on its own.   -If it does not, and rises above 101 F please call the doctor.  -You will also feel achy and your breasts will be firm. They will also start to leak.  *For breastfeeding concerns, the lactation consultant can be reached at [phone]  Not Breastfeeding: -Avoid breast stimulation and wear supportive bra -ICE helps decrease inflammation and pain -Express milk for comfort by hand, do not empty breast  Postpartum blues: -feelings of happy one minute and sad another minute are normal for the first few weeks. -if it gets worse please let your doctor know. It is very common!!

## 2023-03-12 NOTE — Anesthesia Postprocedure Evaluation (Signed)
Anesthesia Post Note  Patient: NATALYE REOME  Procedure(s) Performed: AN AD HOC LABOR EPIDURAL  Patient location during evaluation: Mother Baby Anesthesia Type: Epidural Level of consciousness: awake and alert Pain management: pain level controlled Vital Signs Assessment: post-procedure vital signs reviewed and stable Respiratory status: spontaneous breathing, nonlabored ventilation and respiratory function stable Cardiovascular status: stable Postop Assessment: no headache, no backache, able to ambulate and no apparent nausea or vomiting Anesthetic complications: no   No notable events documented.   Last Vitals:  Vitals:   03/12/23 0310 03/12/23 0733  BP: 106/73 90/67  Pulse: 81 75  Resp: 18 20  Temp: 36.6 C 36.6 C  SpO2: 100% 99%    Last Pain:  Vitals:   03/12/23 0733  TempSrc: Oral  PainSc:                  Foye Deer

## 2023-03-12 NOTE — Lactation Note (Signed)
This note was copied from a baby's chart. Lactation Consultation Note  Patient Name: Sonya Sanders [id number] Date: 03/12/2023 Age:23 hours Reason for consult: Follow-up assessment;Primapara;Exclusive pumping and bottle feeding;Term   Maternal Data Follow up assessment w/ a P1 patient and a 20hr old baby Sonya.  Patient stated that "she gave in last night and gave infant a bottle of formula".  Per patient she stated that she would like to exclusively pump and feed from a bottle.  Infant was just fed prior to LC's entry into the room.   Feeding Mother's Current Feeding Choice: Breast Milk and Formula  No feeding observed.  Interventions Interventions: Breast feeding basics reviewed;Education  Reviewed milk production expectations to family.  Discussed exclusive breastfeeding and that she can start the pumping piece here at the hospital.  Patient agreed to starting pumping while being admitted. Per patient she would like to call out when she is ready to pump.  Consult Status Consult Status: Follow-up Follow-up type: In-patient    Yvette Rack Rever Pichette 03/12/2023, 11:12 AM

## 2023-03-12 NOTE — Progress Notes (Signed)
Patient discharged home with family.  Discharge instructions, when to follow up, and medications reviewed with patient.  Patient verbalized understanding. Patient will be escorted out by staff.

## 2023-03-15 LAB — SURGICAL PATHOLOGY

## 2024-03-02 ENCOUNTER — Ambulatory Visit: Admitting: Nurse Practitioner
